# Patient Record
Sex: Male | Born: 1940 | Race: White | Hispanic: No | Marital: Married | Smoking: Former smoker
Health system: Southern US, Community
[De-identification: ages and names within clinical notes are randomized; demographics above are authoritative.]

## PROBLEM LIST (undated history)

## (undated) DIAGNOSIS — Z8711 Personal history of peptic ulcer disease: Secondary | ICD-10-CM

## (undated) DIAGNOSIS — E785 Hyperlipidemia, unspecified: Secondary | ICD-10-CM

## (undated) DIAGNOSIS — I10 Essential (primary) hypertension: Secondary | ICD-10-CM

## (undated) DIAGNOSIS — Z8719 Personal history of other diseases of the digestive system: Secondary | ICD-10-CM

## (undated) DIAGNOSIS — G5600 Carpal tunnel syndrome, unspecified upper limb: Secondary | ICD-10-CM

## (undated) HISTORY — PX: APPENDECTOMY: SHX54

## (undated) HISTORY — PX: HERNIA REPAIR: SHX51

---

## 2008-06-09 ENCOUNTER — Ambulatory Visit (HOSPITAL_COMMUNITY): Admission: RE | Admit: 2008-06-09 | Discharge: 2008-06-09 | Payer: Self-pay | Admitting: Urology

## 2008-06-23 ENCOUNTER — Ambulatory Visit (HOSPITAL_COMMUNITY): Admission: RE | Admit: 2008-06-23 | Discharge: 2008-06-23 | Payer: Self-pay | Admitting: Urology

## 2013-10-22 ENCOUNTER — Other Ambulatory Visit (HOSPITAL_COMMUNITY): Payer: Self-pay | Admitting: Orthopedic Surgery

## 2013-10-22 ENCOUNTER — Ambulatory Visit (HOSPITAL_COMMUNITY)
Admission: RE | Admit: 2013-10-22 | Discharge: 2013-10-22 | Disposition: A | Discharge status: Home / Self Care | Payer: Medicare Other | Source: Ambulatory Visit | Attending: Vascular Surgery | Admitting: Vascular Surgery

## 2013-10-22 DIAGNOSIS — R52 Pain, unspecified: Secondary | ICD-10-CM

## 2013-10-22 DIAGNOSIS — M79609 Pain in unspecified limb: Secondary | ICD-10-CM | POA: Insufficient documentation

## 2013-10-22 DIAGNOSIS — R6889 Other general symptoms and signs: Secondary | ICD-10-CM

## 2016-02-01 DIAGNOSIS — G6289 Other specified polyneuropathies: Secondary | ICD-10-CM | POA: Diagnosis not present

## 2016-02-01 DIAGNOSIS — E782 Mixed hyperlipidemia: Secondary | ICD-10-CM | POA: Diagnosis not present

## 2016-02-01 DIAGNOSIS — N183 Chronic kidney disease, stage 3 (moderate): Secondary | ICD-10-CM | POA: Diagnosis not present

## 2016-02-01 DIAGNOSIS — Z0001 Encounter for general adult medical examination with abnormal findings: Secondary | ICD-10-CM | POA: Diagnosis not present

## 2016-02-01 DIAGNOSIS — Z9189 Other specified personal risk factors, not elsewhere classified: Secondary | ICD-10-CM | POA: Diagnosis not present

## 2016-02-08 DIAGNOSIS — Z23 Encounter for immunization: Secondary | ICD-10-CM | POA: Diagnosis not present

## 2016-02-08 DIAGNOSIS — Z0001 Encounter for general adult medical examination with abnormal findings: Secondary | ICD-10-CM | POA: Diagnosis not present

## 2016-02-08 DIAGNOSIS — Z6822 Body mass index (BMI) 22.0-22.9, adult: Secondary | ICD-10-CM | POA: Diagnosis not present

## 2016-02-08 DIAGNOSIS — E782 Mixed hyperlipidemia: Secondary | ICD-10-CM | POA: Diagnosis not present

## 2016-02-08 DIAGNOSIS — K219 Gastro-esophageal reflux disease without esophagitis: Secondary | ICD-10-CM | POA: Diagnosis not present

## 2016-02-08 DIAGNOSIS — Z122 Encounter for screening for malignant neoplasm of respiratory organs: Secondary | ICD-10-CM | POA: Diagnosis not present

## 2016-08-11 DIAGNOSIS — N183 Chronic kidney disease, stage 3 (moderate): Secondary | ICD-10-CM | POA: Diagnosis not present

## 2016-08-11 DIAGNOSIS — E782 Mixed hyperlipidemia: Secondary | ICD-10-CM | POA: Diagnosis not present

## 2016-08-17 DIAGNOSIS — Z23 Encounter for immunization: Secondary | ICD-10-CM | POA: Diagnosis not present

## 2016-08-17 DIAGNOSIS — Z6823 Body mass index (BMI) 23.0-23.9, adult: Secondary | ICD-10-CM | POA: Diagnosis not present

## 2016-08-17 DIAGNOSIS — E782 Mixed hyperlipidemia: Secondary | ICD-10-CM | POA: Diagnosis not present

## 2016-08-17 DIAGNOSIS — K219 Gastro-esophageal reflux disease without esophagitis: Secondary | ICD-10-CM | POA: Diagnosis not present

## 2016-10-15 DIAGNOSIS — Z6823 Body mass index (BMI) 23.0-23.9, adult: Secondary | ICD-10-CM | POA: Diagnosis not present

## 2016-10-15 DIAGNOSIS — J0100 Acute maxillary sinusitis, unspecified: Secondary | ICD-10-CM | POA: Diagnosis not present

## 2016-11-08 DIAGNOSIS — R05 Cough: Secondary | ICD-10-CM | POA: Diagnosis not present

## 2016-11-08 DIAGNOSIS — Z6823 Body mass index (BMI) 23.0-23.9, adult: Secondary | ICD-10-CM | POA: Diagnosis not present

## 2017-02-10 DIAGNOSIS — E782 Mixed hyperlipidemia: Secondary | ICD-10-CM | POA: Diagnosis not present

## 2017-02-10 DIAGNOSIS — Z0001 Encounter for general adult medical examination with abnormal findings: Secondary | ICD-10-CM | POA: Diagnosis not present

## 2017-02-10 DIAGNOSIS — Z23 Encounter for immunization: Secondary | ICD-10-CM | POA: Diagnosis not present

## 2017-02-10 DIAGNOSIS — Z122 Encounter for screening for malignant neoplasm of respiratory organs: Secondary | ICD-10-CM | POA: Diagnosis not present

## 2017-02-10 DIAGNOSIS — K219 Gastro-esophageal reflux disease without esophagitis: Secondary | ICD-10-CM | POA: Diagnosis not present

## 2017-04-11 DIAGNOSIS — Z6823 Body mass index (BMI) 23.0-23.9, adult: Secondary | ICD-10-CM | POA: Diagnosis not present

## 2017-04-11 DIAGNOSIS — I10 Essential (primary) hypertension: Secondary | ICD-10-CM | POA: Diagnosis not present

## 2017-04-13 DIAGNOSIS — H2523 Age-related cataract, morgagnian type, bilateral: Secondary | ICD-10-CM | POA: Diagnosis not present

## 2017-04-13 DIAGNOSIS — I10 Essential (primary) hypertension: Secondary | ICD-10-CM | POA: Diagnosis not present

## 2017-04-13 DIAGNOSIS — Z6823 Body mass index (BMI) 23.0-23.9, adult: Secondary | ICD-10-CM | POA: Diagnosis not present

## 2017-04-17 ENCOUNTER — Encounter (HOSPITAL_COMMUNITY): Payer: Self-pay

## 2017-04-17 ENCOUNTER — Emergency Department (HOSPITAL_COMMUNITY)
Admission: EM | Admit: 2017-04-17 | Discharge: 2017-04-17 | Disposition: A | Discharge status: Home / Self Care | Payer: Medicare Other | Attending: Emergency Medicine | Admitting: Emergency Medicine

## 2017-04-17 ENCOUNTER — Emergency Department (HOSPITAL_COMMUNITY): Payer: Medicare Other

## 2017-04-17 DIAGNOSIS — F17220 Nicotine dependence, chewing tobacco, uncomplicated: Secondary | ICD-10-CM | POA: Diagnosis not present

## 2017-04-17 DIAGNOSIS — I10 Essential (primary) hypertension: Secondary | ICD-10-CM | POA: Insufficient documentation

## 2017-04-17 DIAGNOSIS — F17210 Nicotine dependence, cigarettes, uncomplicated: Secondary | ICD-10-CM | POA: Diagnosis not present

## 2017-04-17 DIAGNOSIS — Z79899 Other long term (current) drug therapy: Secondary | ICD-10-CM | POA: Diagnosis not present

## 2017-04-17 DIAGNOSIS — R42 Dizziness and giddiness: Secondary | ICD-10-CM | POA: Insufficient documentation

## 2017-04-17 DIAGNOSIS — H538 Other visual disturbances: Secondary | ICD-10-CM | POA: Diagnosis not present

## 2017-04-17 HISTORY — DX: Personal history of peptic ulcer disease: Z87.11

## 2017-04-17 HISTORY — DX: Essential (primary) hypertension: I10

## 2017-04-17 HISTORY — DX: Hyperlipidemia, unspecified: E78.5

## 2017-04-17 HISTORY — DX: Personal history of other diseases of the digestive system: Z87.19

## 2017-04-17 HISTORY — DX: Carpal tunnel syndrome, unspecified upper limb: G56.00

## 2017-04-17 LAB — COMPREHENSIVE METABOLIC PANEL
ALT: 19 U/L (ref 17–63)
AST: 22 U/L (ref 15–41)
Albumin: 4.2 g/dL (ref 3.5–5.0)
Alkaline Phosphatase: 54 U/L (ref 38–126)
Anion gap: 6 (ref 5–15)
BUN: 25 mg/dL — ABNORMAL HIGH (ref 6–20)
CHLORIDE: 107 mmol/L (ref 101–111)
CO2: 27 mmol/L (ref 22–32)
Calcium: 9.1 mg/dL (ref 8.9–10.3)
Creatinine, Ser: 1.21 mg/dL (ref 0.61–1.24)
GFR, EST NON AFRICAN AMERICAN: 56 mL/min — AB (ref >60)
Glucose, Bld: 104 mg/dL — ABNORMAL HIGH (ref 65–99)
POTASSIUM: 4 mmol/L (ref 3.5–5.1)
Sodium: 140 mmol/L (ref 135–145)
TOTAL PROTEIN: 7.2 g/dL (ref 6.5–8.1)
Total Bilirubin: 0.5 mg/dL (ref 0.3–1.2)

## 2017-04-17 LAB — CBC WITH DIFFERENTIAL/PLATELET
Basophils Absolute: 0.1 10*3/uL (ref 0.0–0.1)
Basophils Relative: 1 %
EOS PCT: 5 %
Eosinophils Absolute: 0.5 10*3/uL (ref 0.0–0.7)
HCT: 38.9 % — ABNORMAL LOW (ref 39.0–52.0)
Hemoglobin: 13.6 g/dL (ref 13.0–17.0)
LYMPHS ABS: 2.9 10*3/uL (ref 0.7–4.0)
LYMPHS PCT: 31 %
MCH: 32.2 pg (ref 26.0–34.0)
MCHC: 35 g/dL (ref 30.0–36.0)
MCV: 92 fL (ref 78.0–100.0)
MONO ABS: 0.7 10*3/uL (ref 0.1–1.0)
MONOS PCT: 7 %
Neutro Abs: 5.4 10*3/uL (ref 1.7–7.7)
Neutrophils Relative %: 56 %
PLATELETS: 251 10*3/uL (ref 150–400)
RBC: 4.23 MIL/uL (ref 4.22–5.81)
RDW: 12.8 % (ref 11.5–15.5)
WBC: 9.5 10*3/uL (ref 4.0–10.5)

## 2017-04-17 NOTE — ED Notes (Signed)
Pt states feeling better at this time. Still has some vision changes, but family says that was going on before events today. Has appointment w/ opthalmolgist this week. Pt says when this starts he has been bent over both times.

## 2017-04-17 NOTE — ED Notes (Signed)
Pt alert & oriented x4, stable gait. Patient given discharge instructions, paperwork & prescription(s). Patient  instructed to stop at the registration desk to finish any additional paperwork. Patient verbalized understanding. Pt left department w/ no further questions. 

## 2017-04-17 NOTE — ED Provider Notes (Signed)
[facility]PT Provider Note   CSN: [id number] Arrival date & time: 04/17/17  1516     History   Chief Complaint Chief Complaint  Patient presents with  . Hypertension    HPI Brian Sexton is a 76 y.o. male.  Patient states that he recently started on blood pressure medicine because he saw the doctor when he was having some blurred vision and dizziness and his blood pressure was elevated. He states that he still having some of these episodes. He has an appointment 2 days with the eye doctor.   The history is provided by the patient. No language interpreter was used.  Hypertension  This is a recurrent problem. The problem occurs constantly. The problem has not changed since onset.Pertinent negatives include no chest pain, no abdominal pain and no headaches. Nothing aggravates the symptoms. Treatments tried: Blood pressure medicine. The treatment provided mild relief.    Past Medical History:  Diagnosis Date  . Carpal tunnel syndrome   . History of stomach ulcers   . Hyperlipidemia   . Hypertension     There are no active problems to display for this patient.   Past Surgical History:  Procedure Laterality Date  . APPENDECTOMY    . HERNIA REPAIR         Home Medications    Prior to Admission medications   Medication Sig Start Date End Date Taking? Authorizing Provider  lisinopril (PRINIVIL,ZESTRIL) 20 MG tablet Take 20 mg by mouth daily.   Yes [provider]  lovastatin (MEVACOR) 20 MG tablet Take 40 mg by mouth daily.   Yes [provider]  predniSONE (DELTASONE) 50 MG tablet Take 50 mg by mouth daily with breakfast. 5 day course for inflammation   Yes [provider]    Family History No family history on file.  Social History Social History  Substance Use Topics  . Smoking status: Former Games developer  . Smokeless tobacco: Current User    Types: Chew     Comment: quit 27 years ago  . Alcohol use No     Allergies   Aspirin  and Penicillins   Review of Systems Review of Systems  Constitutional: Negative for appetite change and fatigue.  HENT: Negative for congestion, ear discharge and sinus pressure.        Some blurred vision  Eyes: Negative for discharge.  Respiratory: Negative for cough.   Cardiovascular: Negative for chest pain.  Gastrointestinal: Negative for abdominal pain and diarrhea.  Genitourinary: Negative for frequency and hematuria.  Musculoskeletal: Negative for back pain.  Skin: Negative for rash.  Neurological: Negative for seizures and headaches.  Psychiatric/Behavioral: Negative for hallucinations.     Physical Exam Updated Vital Signs BP 138/77 (BP Location: Right Arm)   Pulse 60   Temp 98.2 F (36.8 C) (Oral)   Resp 16   Ht 5\' 9"  (1.753 m)   Wt 154 lb (69.9 kg)   SpO2 97%   BMI 22.74 kg/m   Physical Exam  Constitutional: He is oriented to person, place, and time. He appears well-developed.  HENT:  Head: Normocephalic.  Eyes: Conjunctivae and EOM are normal. No scleral icterus.  Neck: Neck supple. No thyromegaly present.  Cardiovascular: Normal rate and regular rhythm.  Exam reveals no gallop and no friction rub.   No murmur heard. Pulmonary/Chest: No stridor. He has no wheezes. He has no rales. He exhibits no tenderness.  Abdominal: He exhibits no distension. There is no tenderness. There is no rebound.  Musculoskeletal: Normal range of motion. He exhibits no edema.  Lymphadenopathy:    He has no cervical adenopathy.  Neurological: He is oriented to person, place, and time. He exhibits normal muscle tone. Coordination normal.  Skin: No rash noted. No erythema.  Psychiatric: He has a normal mood and affect. His behavior is normal.     ED Treatments / Results  Labs (all labs ordered are listed, but only abnormal results are displayed) Labs Reviewed  CBC WITH DIFFERENTIAL/PLATELET - Abnormal; Notable for the following:       Result Value   HCT 38.9 (*)    All  other components within normal limits  COMPREHENSIVE METABOLIC PANEL - Abnormal; Notable for the following:    Glucose, Bld 104 (*)    BUN 25 (*)    GFR calc non Af Amer 56 (*)    All other components within normal limits    EKG  EKG Interpretation None       Radiology Ct Head Wo Contrast  Result Date: 04/17/2017 CLINICAL DATA:  Dizziness and vision changes while working in garden today, similar symptoms for week. History of hypertension, hyperlipidemia. EXAM: CT HEAD WITHOUT CONTRAST TECHNIQUE: Contiguous axial images were obtained from the base of the skull through the vertex without intravenous contrast. COMPARISON:  None. FINDINGS: BRAIN: No intraparenchymal hemorrhage, mass effect nor midline shift. The ventricles and sulci are normal for age. Patchy supratentorial white matter hypodensities less than expected for patient's age, though non-specific are most compatible with chronic small vessel ischemic disease. No acute large vascular territory infarcts. No abnormal extra-axial fluid collections. Basal cisterns are patent. VASCULAR: Mild calcific atherosclerosis of the carotid siphons. SKULL: No skull fracture. No significant scalp soft tissue swelling. SINUSES/ORBITS: The mastoid air-cells and included paranasal sinuses are well-aerated.The included ocular globes and orbital contents are non-suspicious. OTHER: Patient is edentulous. IMPRESSION: Normal noncontrast CT HEAD for age. Electronically Signed   By: Awilda Metroourtnay  Bloomer M.D.   On: 04/17/2017 19:01    Procedures Procedures (including critical care time)  Medications Ordered in ED Medications - No data to display   Initial Impression / Assessment and Plan / ED Course  I have reviewed the triage vital signs and the nursing notes.  Pertinent labs & imaging results that were available during my care of the patient were reviewed by me and considered in my medical decision making (see chart for details).     Labs unremarkable  CT scan head normal. Patient instructed to continue taking his blood pressure medicine as prescribed by his doctor and follow-up with the eye doctor in 2 days as planned. He is then to follow up with family doctor  Final Clinical Impressions(s) / ED Diagnoses   Final diagnoses:  Blurred vision  Essential hypertension    New Prescriptions New Prescriptions   No medications on file     Bethann BerkshireZammit, Disaya Walt, MD 04/17/17 2114

## 2017-04-17 NOTE — Discharge Instructions (Signed)
Follow up with the eye md in 2 days as planned

## 2017-04-17 NOTE — ED Triage Notes (Addendum)
Pt reports that he was working in the garden this morning and everything vision started getting blurry and dizziness. BP checked BP 168/84. Pt reports that he started on lisinopril 20 mg last week for his BP. Attempted to get in to see Dr Darl HouseholderKoneswaren. Pt still complains of blurry vision and slight HA

## 2017-04-29 DIAGNOSIS — H524 Presbyopia: Secondary | ICD-10-CM | POA: Diagnosis not present

## 2017-05-04 DIAGNOSIS — I10 Essential (primary) hypertension: Secondary | ICD-10-CM | POA: Diagnosis not present

## 2017-05-04 DIAGNOSIS — E782 Mixed hyperlipidemia: Secondary | ICD-10-CM | POA: Diagnosis not present

## 2017-05-04 DIAGNOSIS — K219 Gastro-esophageal reflux disease without esophagitis: Secondary | ICD-10-CM | POA: Diagnosis not present

## 2017-05-04 DIAGNOSIS — Z1212 Encounter for screening for malignant neoplasm of rectum: Secondary | ICD-10-CM | POA: Diagnosis not present

## 2017-05-26 DIAGNOSIS — H25013 Cortical age-related cataract, bilateral: Secondary | ICD-10-CM | POA: Diagnosis not present

## 2017-05-26 DIAGNOSIS — H5310 Unspecified subjective visual disturbances: Secondary | ICD-10-CM | POA: Diagnosis not present

## 2017-06-14 DIAGNOSIS — R509 Fever, unspecified: Secondary | ICD-10-CM | POA: Diagnosis not present

## 2017-06-14 DIAGNOSIS — J209 Acute bronchitis, unspecified: Secondary | ICD-10-CM | POA: Diagnosis not present

## 2017-06-14 DIAGNOSIS — Z6823 Body mass index (BMI) 23.0-23.9, adult: Secondary | ICD-10-CM | POA: Diagnosis not present

## 2017-06-16 DIAGNOSIS — H2513 Age-related nuclear cataract, bilateral: Secondary | ICD-10-CM | POA: Diagnosis not present

## 2017-06-16 DIAGNOSIS — H5310 Unspecified subjective visual disturbances: Secondary | ICD-10-CM | POA: Diagnosis not present

## 2017-06-16 DIAGNOSIS — H04123 Dry eye syndrome of bilateral lacrimal glands: Secondary | ICD-10-CM | POA: Diagnosis not present

## 2017-08-08 DIAGNOSIS — I10 Essential (primary) hypertension: Secondary | ICD-10-CM | POA: Diagnosis not present

## 2017-08-08 DIAGNOSIS — E782 Mixed hyperlipidemia: Secondary | ICD-10-CM | POA: Diagnosis not present

## 2017-08-08 DIAGNOSIS — H2523 Age-related cataract, morgagnian type, bilateral: Secondary | ICD-10-CM | POA: Diagnosis not present

## 2017-08-08 DIAGNOSIS — G6289 Other specified polyneuropathies: Secondary | ICD-10-CM | POA: Diagnosis not present

## 2017-08-11 DIAGNOSIS — K219 Gastro-esophageal reflux disease without esophagitis: Secondary | ICD-10-CM | POA: Diagnosis not present

## 2017-08-11 DIAGNOSIS — E782 Mixed hyperlipidemia: Secondary | ICD-10-CM | POA: Diagnosis not present

## 2017-08-11 DIAGNOSIS — Z23 Encounter for immunization: Secondary | ICD-10-CM | POA: Diagnosis not present

## 2017-08-11 DIAGNOSIS — Z122 Encounter for screening for malignant neoplasm of respiratory organs: Secondary | ICD-10-CM | POA: Diagnosis not present

## 2018-02-07 DIAGNOSIS — K210 Gastro-esophageal reflux disease with esophagitis: Secondary | ICD-10-CM | POA: Diagnosis not present

## 2018-02-07 DIAGNOSIS — N183 Chronic kidney disease, stage 3 (moderate): Secondary | ICD-10-CM | POA: Diagnosis not present

## 2018-02-07 DIAGNOSIS — E782 Mixed hyperlipidemia: Secondary | ICD-10-CM | POA: Diagnosis not present

## 2018-02-07 DIAGNOSIS — M1990 Unspecified osteoarthritis, unspecified site: Secondary | ICD-10-CM | POA: Diagnosis not present

## 2018-02-07 DIAGNOSIS — I10 Essential (primary) hypertension: Secondary | ICD-10-CM | POA: Diagnosis not present

## 2018-02-12 DIAGNOSIS — Z23 Encounter for immunization: Secondary | ICD-10-CM | POA: Diagnosis not present

## 2018-02-12 DIAGNOSIS — K219 Gastro-esophageal reflux disease without esophagitis: Secondary | ICD-10-CM | POA: Diagnosis not present

## 2018-02-12 DIAGNOSIS — Z0001 Encounter for general adult medical examination with abnormal findings: Secondary | ICD-10-CM | POA: Diagnosis not present

## 2018-02-12 DIAGNOSIS — E782 Mixed hyperlipidemia: Secondary | ICD-10-CM | POA: Diagnosis not present

## 2018-03-06 DIAGNOSIS — R195 Other fecal abnormalities: Secondary | ICD-10-CM | POA: Diagnosis not present

## 2018-03-08 DIAGNOSIS — K635 Polyp of colon: Secondary | ICD-10-CM | POA: Diagnosis not present

## 2018-03-08 DIAGNOSIS — K219 Gastro-esophageal reflux disease without esophagitis: Secondary | ICD-10-CM | POA: Diagnosis not present

## 2018-03-08 DIAGNOSIS — D125 Benign neoplasm of sigmoid colon: Secondary | ICD-10-CM | POA: Diagnosis not present

## 2018-03-08 DIAGNOSIS — Z79899 Other long term (current) drug therapy: Secondary | ICD-10-CM | POA: Diagnosis not present

## 2018-03-08 DIAGNOSIS — R195 Other fecal abnormalities: Secondary | ICD-10-CM | POA: Diagnosis not present

## 2018-03-08 DIAGNOSIS — E785 Hyperlipidemia, unspecified: Secondary | ICD-10-CM | POA: Diagnosis not present

## 2018-03-08 DIAGNOSIS — I10 Essential (primary) hypertension: Secondary | ICD-10-CM | POA: Diagnosis not present

## 2018-03-08 DIAGNOSIS — D124 Benign neoplasm of descending colon: Secondary | ICD-10-CM | POA: Diagnosis not present

## 2018-03-08 DIAGNOSIS — Z880 Allergy status to penicillin: Secondary | ICD-10-CM | POA: Diagnosis not present

## 2018-03-08 DIAGNOSIS — Z886 Allergy status to analgesic agent status: Secondary | ICD-10-CM | POA: Diagnosis not present

## 2018-03-21 DIAGNOSIS — D124 Benign neoplasm of descending colon: Secondary | ICD-10-CM | POA: Diagnosis not present

## 2018-04-12 DIAGNOSIS — H04123 Dry eye syndrome of bilateral lacrimal glands: Secondary | ICD-10-CM | POA: Diagnosis not present

## 2018-08-13 DIAGNOSIS — K210 Gastro-esophageal reflux disease with esophagitis: Secondary | ICD-10-CM | POA: Diagnosis not present

## 2018-08-13 DIAGNOSIS — E782 Mixed hyperlipidemia: Secondary | ICD-10-CM | POA: Diagnosis not present

## 2018-08-13 DIAGNOSIS — I10 Essential (primary) hypertension: Secondary | ICD-10-CM | POA: Diagnosis not present

## 2018-08-13 DIAGNOSIS — N183 Chronic kidney disease, stage 3 (moderate): Secondary | ICD-10-CM | POA: Diagnosis not present

## 2018-08-13 DIAGNOSIS — E039 Hypothyroidism, unspecified: Secondary | ICD-10-CM | POA: Diagnosis not present

## 2018-08-13 DIAGNOSIS — Z9189 Other specified personal risk factors, not elsewhere classified: Secondary | ICD-10-CM | POA: Diagnosis not present

## 2018-08-16 DIAGNOSIS — I10 Essential (primary) hypertension: Secondary | ICD-10-CM | POA: Diagnosis not present

## 2018-08-16 DIAGNOSIS — K219 Gastro-esophageal reflux disease without esophagitis: Secondary | ICD-10-CM | POA: Diagnosis not present

## 2018-08-16 DIAGNOSIS — E782 Mixed hyperlipidemia: Secondary | ICD-10-CM | POA: Diagnosis not present

## 2018-08-16 DIAGNOSIS — Z23 Encounter for immunization: Secondary | ICD-10-CM | POA: Diagnosis not present

## 2018-12-07 DIAGNOSIS — J019 Acute sinusitis, unspecified: Secondary | ICD-10-CM | POA: Diagnosis not present

## 2018-12-07 DIAGNOSIS — J209 Acute bronchitis, unspecified: Secondary | ICD-10-CM | POA: Diagnosis not present

## 2018-12-07 DIAGNOSIS — Z6823 Body mass index (BMI) 23.0-23.9, adult: Secondary | ICD-10-CM | POA: Diagnosis not present

## 2019-02-05 IMAGING — CT CT HEAD W/O CM
3 series · 16 of 47 positions shown, 19 images · non-contrast
Comparison: None.

CLINICAL DATA: Dizziness and vision changes while working in garden
today, similar symptoms for week. History of hypertension,
hyperlipidemia.

EXAM:
CT HEAD WITHOUT CONTRAST
TECHNIQUE: Contiguous axial images were obtained from the base of the skull
through the vertex without intravenous contrast.

[Series 2: head wo · axial · 0.43mm/px · z∈[+1505,+1630]mm · 10 of 30 slices shown, 13 images]
[im 3/30  brain]
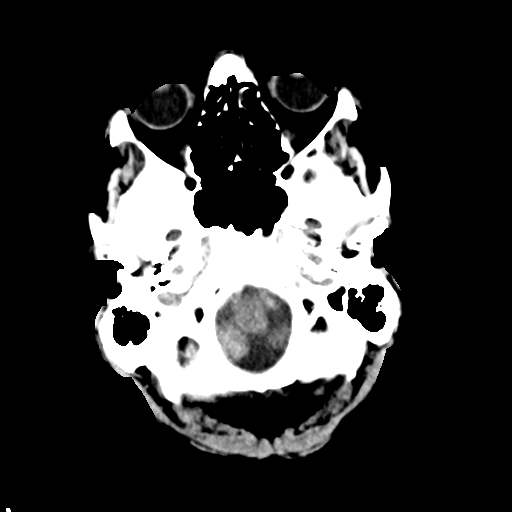
[im 3/30  bone]
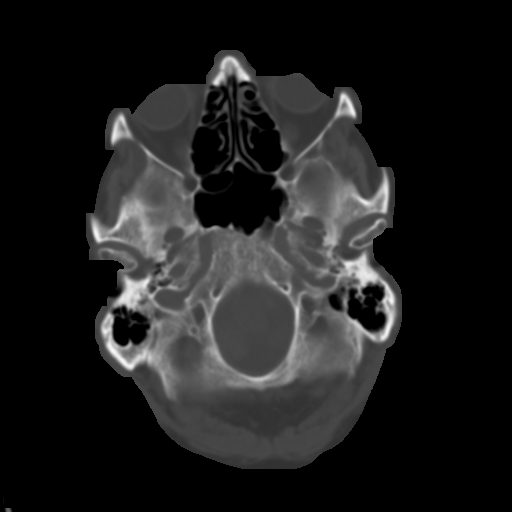
[im 6/30  brain]
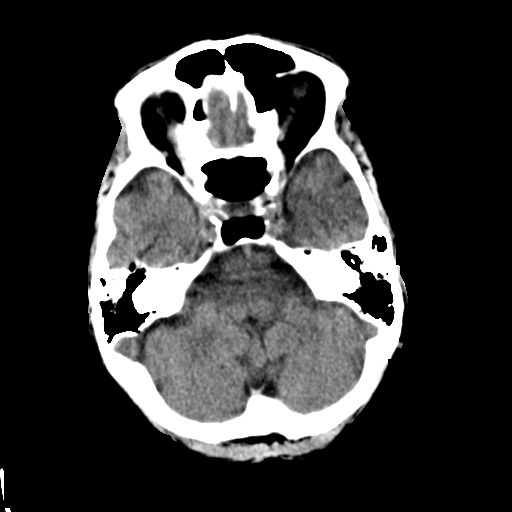
[im 9/30  brain]
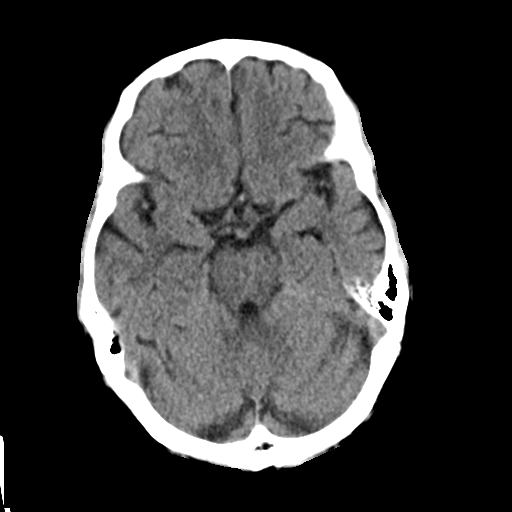
[im 11/30  brain]
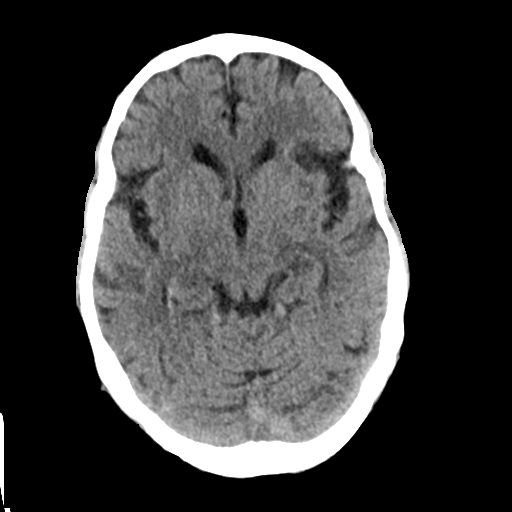
[im 14/30  brain]
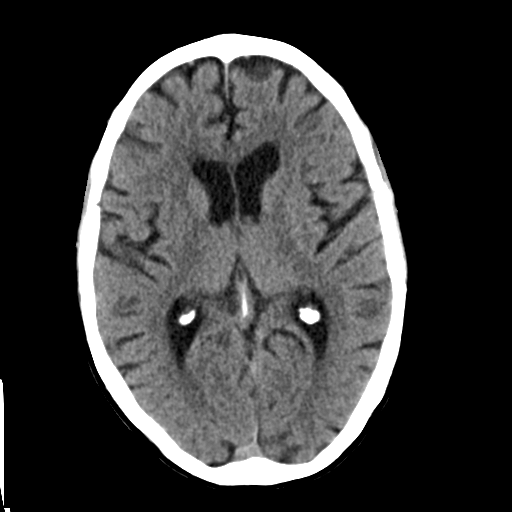
[im 14/30  bone]
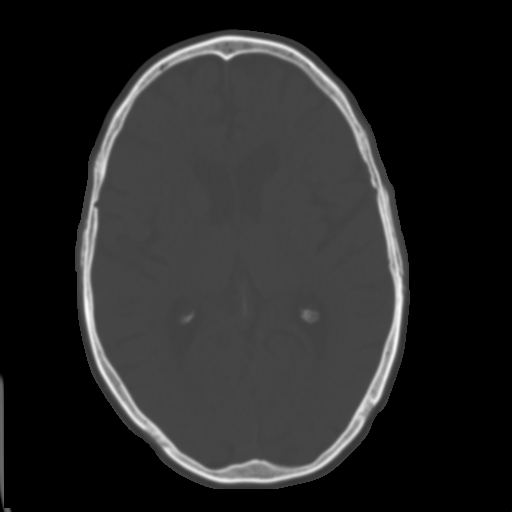
[im 17/30  brain]
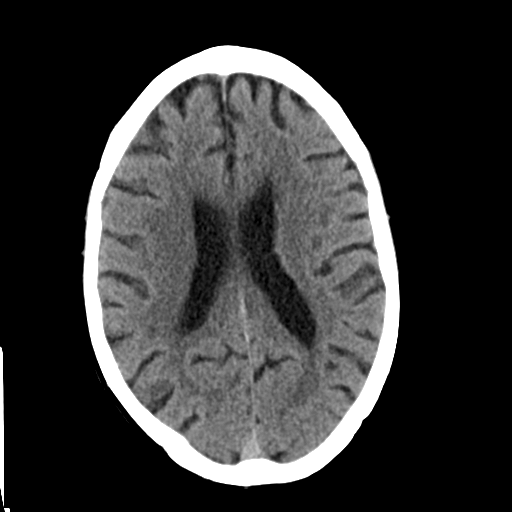
[im 20/30  brain]
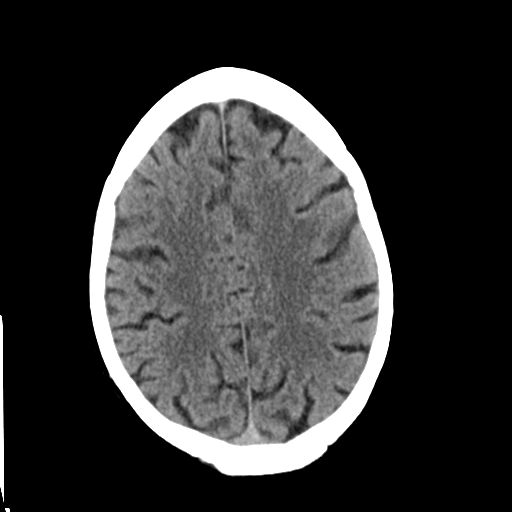
[im 23/30  brain]
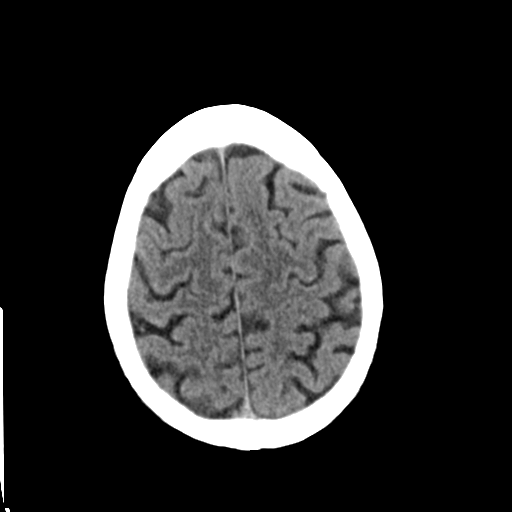
[im 25/30  brain]
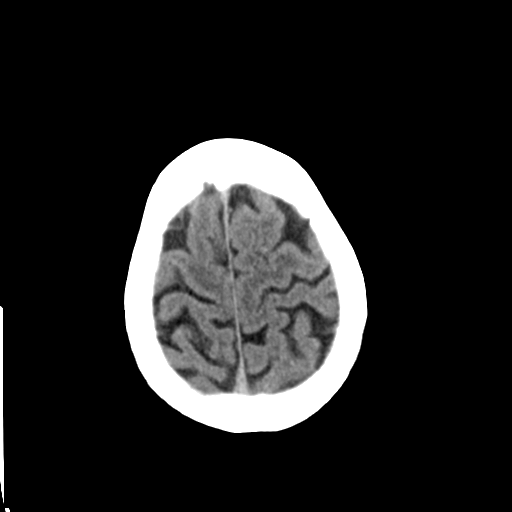
[im 25/30  bone]
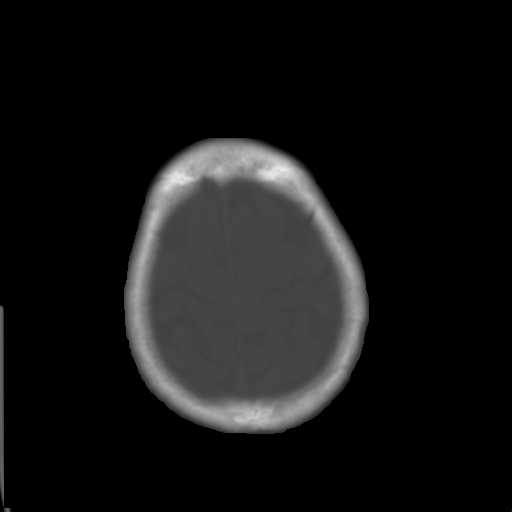
[im 28/30  brain]
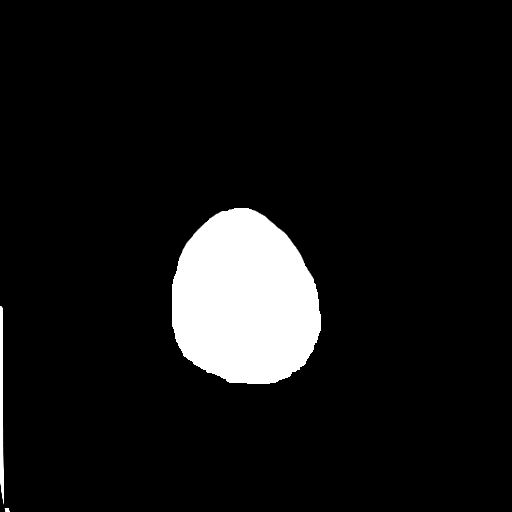

[Series 4: coronal soft tissue · coronal · 0.31mm/px · 3 of 68 slices shown]
[im 23/68  brain]
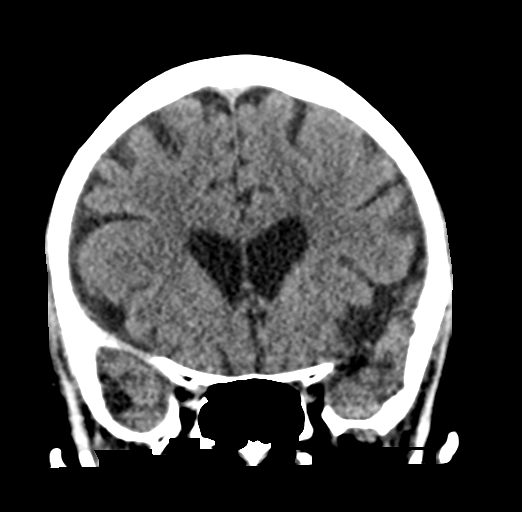
[im 30/68  brain]
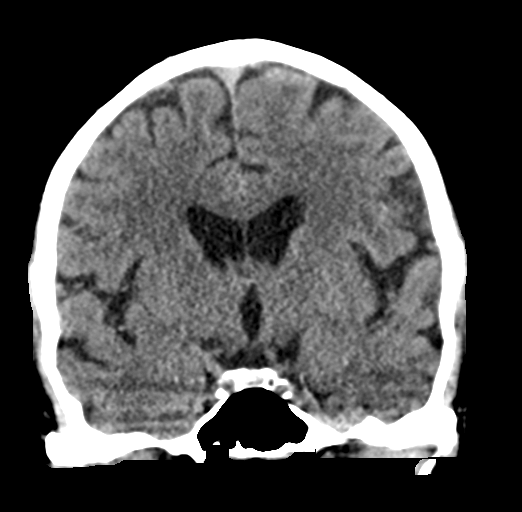
[im 38/68  brain]
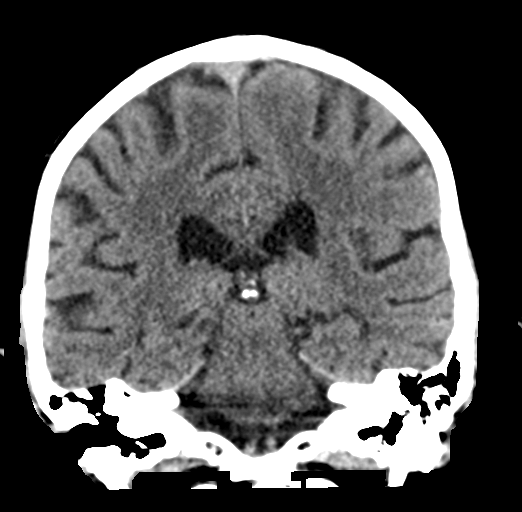

[Series 5: sagittal soft tissue · sagittal · 0.33mm/px · 3 of 51 slices shown]
[im 17/51  brain]
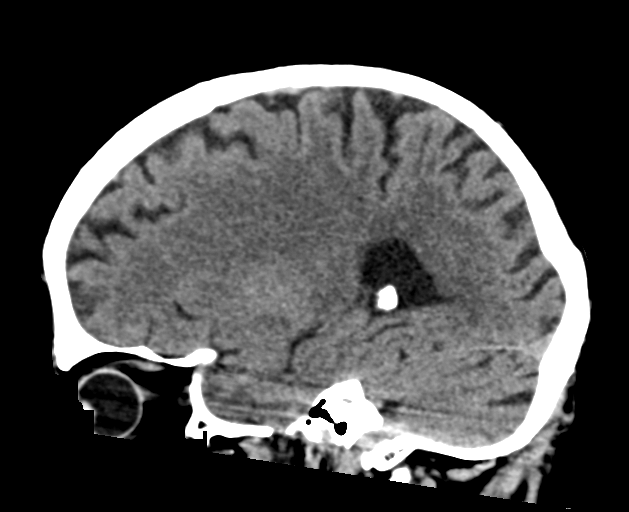
[im 26/51  brain]
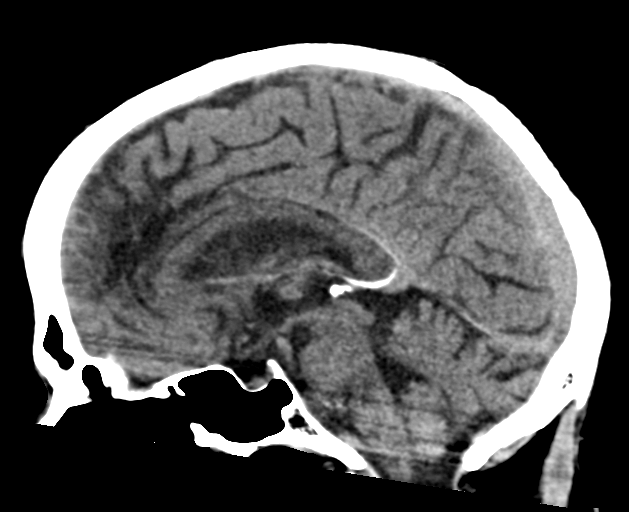
[im 34/51  brain]
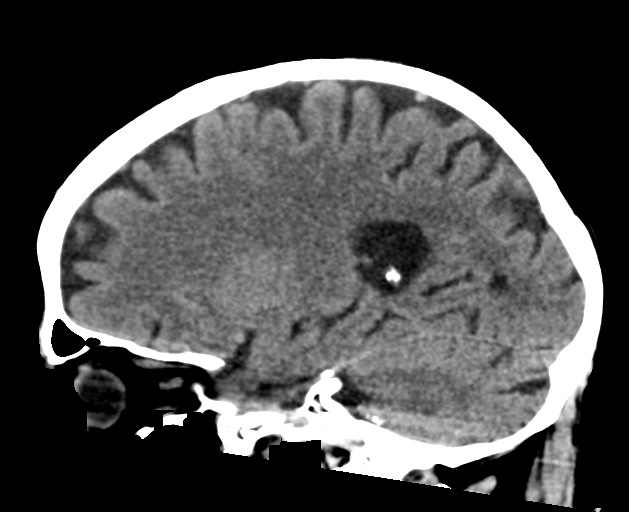

[16 of 47 positions shown; findings below may reference images not displayed]

FINDINGS: BRAIN: No intraparenchymal hemorrhage, mass effect nor midline
shift. The ventricles and sulci are normal for age. Patchy
supratentorial white matter hypodensities less than expected for
patient's age, though non-specific are most compatible with chronic
small vessel ischemic disease. No acute large vascular territory
infarcts. No abnormal extra-axial fluid collections. Basal cisterns
are patent.

VASCULAR: Mild calcific atherosclerosis of the carotid siphons.

SKULL: No skull fracture. No significant scalp soft tissue swelling.

SINUSES/ORBITS: The mastoid air-cells and included paranasal sinuses
are well-aerated.The included ocular globes and orbital contents are
non-suspicious.

OTHER: Patient is edentulous.
IMPRESSION: Normal noncontrast CT HEAD for age.

## 2019-04-11 DIAGNOSIS — I10 Essential (primary) hypertension: Secondary | ICD-10-CM | POA: Diagnosis not present

## 2019-04-11 DIAGNOSIS — E782 Mixed hyperlipidemia: Secondary | ICD-10-CM | POA: Diagnosis not present

## 2019-04-11 DIAGNOSIS — N183 Chronic kidney disease, stage 3 (moderate): Secondary | ICD-10-CM | POA: Diagnosis not present

## 2019-04-11 DIAGNOSIS — K210 Gastro-esophageal reflux disease with esophagitis: Secondary | ICD-10-CM | POA: Diagnosis not present

## 2019-04-15 DIAGNOSIS — E782 Mixed hyperlipidemia: Secondary | ICD-10-CM | POA: Diagnosis not present

## 2019-04-15 DIAGNOSIS — Z0001 Encounter for general adult medical examination with abnormal findings: Secondary | ICD-10-CM | POA: Diagnosis not present

## 2019-04-15 DIAGNOSIS — K219 Gastro-esophageal reflux disease without esophagitis: Secondary | ICD-10-CM | POA: Diagnosis not present

## 2019-04-15 DIAGNOSIS — I10 Essential (primary) hypertension: Secondary | ICD-10-CM | POA: Diagnosis not present

## 2019-07-05 DIAGNOSIS — E785 Hyperlipidemia, unspecified: Secondary | ICD-10-CM | POA: Diagnosis not present

## 2019-07-05 DIAGNOSIS — I10 Essential (primary) hypertension: Secondary | ICD-10-CM | POA: Diagnosis not present

## 2019-08-05 DIAGNOSIS — I10 Essential (primary) hypertension: Secondary | ICD-10-CM | POA: Diagnosis not present

## 2019-08-05 DIAGNOSIS — E782 Mixed hyperlipidemia: Secondary | ICD-10-CM | POA: Diagnosis not present

## 2019-08-21 DIAGNOSIS — Z23 Encounter for immunization: Secondary | ICD-10-CM | POA: Diagnosis not present

## 2019-10-04 DIAGNOSIS — I10 Essential (primary) hypertension: Secondary | ICD-10-CM | POA: Diagnosis not present

## 2019-10-04 DIAGNOSIS — E782 Mixed hyperlipidemia: Secondary | ICD-10-CM | POA: Diagnosis not present

## 2019-10-11 DIAGNOSIS — E782 Mixed hyperlipidemia: Secondary | ICD-10-CM | POA: Diagnosis not present

## 2019-10-11 DIAGNOSIS — N183 Chronic kidney disease, stage 3 unspecified: Secondary | ICD-10-CM | POA: Diagnosis not present

## 2019-10-11 DIAGNOSIS — I10 Essential (primary) hypertension: Secondary | ICD-10-CM | POA: Diagnosis not present

## 2019-10-11 DIAGNOSIS — K219 Gastro-esophageal reflux disease without esophagitis: Secondary | ICD-10-CM | POA: Diagnosis not present

## 2019-10-16 DIAGNOSIS — I10 Essential (primary) hypertension: Secondary | ICD-10-CM | POA: Diagnosis not present

## 2019-10-16 DIAGNOSIS — K219 Gastro-esophageal reflux disease without esophagitis: Secondary | ICD-10-CM | POA: Diagnosis not present

## 2019-10-16 DIAGNOSIS — E782 Mixed hyperlipidemia: Secondary | ICD-10-CM | POA: Diagnosis not present

## 2019-10-16 DIAGNOSIS — Z122 Encounter for screening for malignant neoplasm of respiratory organs: Secondary | ICD-10-CM | POA: Diagnosis not present

## 2019-12-05 DIAGNOSIS — I10 Essential (primary) hypertension: Secondary | ICD-10-CM | POA: Diagnosis not present

## 2019-12-05 DIAGNOSIS — E782 Mixed hyperlipidemia: Secondary | ICD-10-CM | POA: Diagnosis not present

## 2020-01-31 DIAGNOSIS — E7849 Other hyperlipidemia: Secondary | ICD-10-CM | POA: Diagnosis not present

## 2020-01-31 DIAGNOSIS — I10 Essential (primary) hypertension: Secondary | ICD-10-CM | POA: Diagnosis not present

## 2020-03-04 DIAGNOSIS — E7849 Other hyperlipidemia: Secondary | ICD-10-CM | POA: Diagnosis not present

## 2020-03-04 DIAGNOSIS — I10 Essential (primary) hypertension: Secondary | ICD-10-CM | POA: Diagnosis not present

## 2020-04-02 DIAGNOSIS — Z6822 Body mass index (BMI) 22.0-22.9, adult: Secondary | ICD-10-CM | POA: Diagnosis not present

## 2020-04-02 DIAGNOSIS — M25551 Pain in right hip: Secondary | ICD-10-CM | POA: Diagnosis not present

## 2020-04-02 DIAGNOSIS — M25531 Pain in right wrist: Secondary | ICD-10-CM | POA: Diagnosis not present

## 2020-04-13 DIAGNOSIS — K219 Gastro-esophageal reflux disease without esophagitis: Secondary | ICD-10-CM | POA: Diagnosis not present

## 2020-04-13 DIAGNOSIS — D649 Anemia, unspecified: Secondary | ICD-10-CM | POA: Diagnosis not present

## 2020-04-13 DIAGNOSIS — E782 Mixed hyperlipidemia: Secondary | ICD-10-CM | POA: Diagnosis not present

## 2020-04-13 DIAGNOSIS — I10 Essential (primary) hypertension: Secondary | ICD-10-CM | POA: Diagnosis not present

## 2020-04-13 DIAGNOSIS — N183 Chronic kidney disease, stage 3 unspecified: Secondary | ICD-10-CM | POA: Diagnosis not present

## 2020-04-14 DIAGNOSIS — S63501A Unspecified sprain of right wrist, initial encounter: Secondary | ICD-10-CM | POA: Diagnosis not present

## 2020-04-14 DIAGNOSIS — M25531 Pain in right wrist: Secondary | ICD-10-CM | POA: Diagnosis not present

## 2020-04-17 DIAGNOSIS — K219 Gastro-esophageal reflux disease without esophagitis: Secondary | ICD-10-CM | POA: Diagnosis not present

## 2020-04-17 DIAGNOSIS — I10 Essential (primary) hypertension: Secondary | ICD-10-CM | POA: Diagnosis not present

## 2020-04-17 DIAGNOSIS — Z0001 Encounter for general adult medical examination with abnormal findings: Secondary | ICD-10-CM | POA: Diagnosis not present

## 2020-04-17 DIAGNOSIS — E782 Mixed hyperlipidemia: Secondary | ICD-10-CM | POA: Diagnosis not present

## 2020-04-23 DIAGNOSIS — E538 Deficiency of other specified B group vitamins: Secondary | ICD-10-CM | POA: Diagnosis not present

## 2020-04-30 DIAGNOSIS — E538 Deficiency of other specified B group vitamins: Secondary | ICD-10-CM | POA: Diagnosis not present

## 2020-05-07 DIAGNOSIS — E538 Deficiency of other specified B group vitamins: Secondary | ICD-10-CM | POA: Diagnosis not present

## 2020-05-25 DIAGNOSIS — R195 Other fecal abnormalities: Secondary | ICD-10-CM | POA: Diagnosis not present

## 2020-06-03 DIAGNOSIS — E7849 Other hyperlipidemia: Secondary | ICD-10-CM | POA: Diagnosis not present

## 2020-06-03 DIAGNOSIS — K219 Gastro-esophageal reflux disease without esophagitis: Secondary | ICD-10-CM | POA: Diagnosis not present

## 2020-06-03 DIAGNOSIS — I129 Hypertensive chronic kidney disease with stage 1 through stage 4 chronic kidney disease, or unspecified chronic kidney disease: Secondary | ICD-10-CM | POA: Diagnosis not present

## 2020-06-03 DIAGNOSIS — N183 Chronic kidney disease, stage 3 unspecified: Secondary | ICD-10-CM | POA: Diagnosis not present

## 2020-06-04 DIAGNOSIS — E538 Deficiency of other specified B group vitamins: Secondary | ICD-10-CM | POA: Diagnosis not present

## 2020-06-20 DIAGNOSIS — R197 Diarrhea, unspecified: Secondary | ICD-10-CM | POA: Diagnosis not present

## 2020-06-20 DIAGNOSIS — K921 Melena: Secondary | ICD-10-CM | POA: Diagnosis not present

## 2020-06-23 DIAGNOSIS — Z01818 Encounter for other preprocedural examination: Secondary | ICD-10-CM | POA: Diagnosis not present

## 2020-06-25 DIAGNOSIS — D126 Benign neoplasm of colon, unspecified: Secondary | ICD-10-CM | POA: Diagnosis not present

## 2020-06-25 DIAGNOSIS — I10 Essential (primary) hypertension: Secondary | ICD-10-CM | POA: Diagnosis not present

## 2020-06-25 DIAGNOSIS — D123 Benign neoplasm of transverse colon: Secondary | ICD-10-CM | POA: Diagnosis not present

## 2020-06-25 DIAGNOSIS — R195 Other fecal abnormalities: Secondary | ICD-10-CM | POA: Diagnosis not present

## 2020-06-25 DIAGNOSIS — K635 Polyp of colon: Secondary | ICD-10-CM | POA: Diagnosis not present

## 2020-06-25 DIAGNOSIS — K640 First degree hemorrhoids: Secondary | ICD-10-CM | POA: Diagnosis not present

## 2020-06-25 DIAGNOSIS — Z87891 Personal history of nicotine dependence: Secondary | ICD-10-CM | POA: Diagnosis not present

## 2020-06-25 DIAGNOSIS — E785 Hyperlipidemia, unspecified: Secondary | ICD-10-CM | POA: Diagnosis not present

## 2020-06-25 DIAGNOSIS — Z880 Allergy status to penicillin: Secondary | ICD-10-CM | POA: Diagnosis not present

## 2020-06-25 DIAGNOSIS — K620 Anal polyp: Secondary | ICD-10-CM | POA: Diagnosis not present

## 2020-06-25 DIAGNOSIS — K5730 Diverticulosis of large intestine without perforation or abscess without bleeding: Secondary | ICD-10-CM | POA: Diagnosis not present

## 2020-06-25 DIAGNOSIS — K5732 Diverticulitis of large intestine without perforation or abscess without bleeding: Secondary | ICD-10-CM | POA: Diagnosis not present

## 2020-06-25 DIAGNOSIS — Z79899 Other long term (current) drug therapy: Secondary | ICD-10-CM | POA: Diagnosis not present

## 2020-06-25 DIAGNOSIS — M1990 Unspecified osteoarthritis, unspecified site: Secondary | ICD-10-CM | POA: Diagnosis not present

## 2020-07-03 DIAGNOSIS — N183 Chronic kidney disease, stage 3 unspecified: Secondary | ICD-10-CM | POA: Diagnosis not present

## 2020-07-03 DIAGNOSIS — I129 Hypertensive chronic kidney disease with stage 1 through stage 4 chronic kidney disease, or unspecified chronic kidney disease: Secondary | ICD-10-CM | POA: Diagnosis not present

## 2020-07-03 DIAGNOSIS — E7849 Other hyperlipidemia: Secondary | ICD-10-CM | POA: Diagnosis not present

## 2020-07-03 DIAGNOSIS — K219 Gastro-esophageal reflux disease without esophagitis: Secondary | ICD-10-CM | POA: Diagnosis not present

## 2020-07-06 DIAGNOSIS — E538 Deficiency of other specified B group vitamins: Secondary | ICD-10-CM | POA: Diagnosis not present

## 2020-07-22 DIAGNOSIS — D126 Benign neoplasm of colon, unspecified: Secondary | ICD-10-CM | POA: Diagnosis not present

## 2020-08-05 DIAGNOSIS — E538 Deficiency of other specified B group vitamins: Secondary | ICD-10-CM | POA: Diagnosis not present

## 2020-08-20 DIAGNOSIS — Z20828 Contact with and (suspected) exposure to other viral communicable diseases: Secondary | ICD-10-CM | POA: Diagnosis not present

## 2020-09-03 DIAGNOSIS — K219 Gastro-esophageal reflux disease without esophagitis: Secondary | ICD-10-CM | POA: Diagnosis not present

## 2020-09-03 DIAGNOSIS — I129 Hypertensive chronic kidney disease with stage 1 through stage 4 chronic kidney disease, or unspecified chronic kidney disease: Secondary | ICD-10-CM | POA: Diagnosis not present

## 2020-09-03 DIAGNOSIS — E7849 Other hyperlipidemia: Secondary | ICD-10-CM | POA: Diagnosis not present

## 2020-09-03 DIAGNOSIS — Z23 Encounter for immunization: Secondary | ICD-10-CM | POA: Diagnosis not present

## 2020-09-03 DIAGNOSIS — N183 Chronic kidney disease, stage 3 unspecified: Secondary | ICD-10-CM | POA: Diagnosis not present

## 2020-09-04 DIAGNOSIS — E538 Deficiency of other specified B group vitamins: Secondary | ICD-10-CM | POA: Diagnosis not present

## 2020-10-05 DIAGNOSIS — E538 Deficiency of other specified B group vitamins: Secondary | ICD-10-CM | POA: Diagnosis not present

## 2020-10-26 DIAGNOSIS — D649 Anemia, unspecified: Secondary | ICD-10-CM | POA: Diagnosis not present

## 2020-10-26 DIAGNOSIS — K219 Gastro-esophageal reflux disease without esophagitis: Secondary | ICD-10-CM | POA: Diagnosis not present

## 2020-10-26 DIAGNOSIS — D519 Vitamin B12 deficiency anemia, unspecified: Secondary | ICD-10-CM | POA: Diagnosis not present

## 2020-10-26 DIAGNOSIS — D529 Folate deficiency anemia, unspecified: Secondary | ICD-10-CM | POA: Diagnosis not present

## 2020-10-28 DIAGNOSIS — E782 Mixed hyperlipidemia: Secondary | ICD-10-CM | POA: Diagnosis not present

## 2020-10-28 DIAGNOSIS — I10 Essential (primary) hypertension: Secondary | ICD-10-CM | POA: Diagnosis not present

## 2020-10-28 DIAGNOSIS — K2100 Gastro-esophageal reflux disease with esophagitis, without bleeding: Secondary | ICD-10-CM | POA: Diagnosis not present

## 2020-10-28 DIAGNOSIS — D510 Vitamin B12 deficiency anemia due to intrinsic factor deficiency: Secondary | ICD-10-CM | POA: Diagnosis not present

## 2020-12-04 DIAGNOSIS — I129 Hypertensive chronic kidney disease with stage 1 through stage 4 chronic kidney disease, or unspecified chronic kidney disease: Secondary | ICD-10-CM | POA: Diagnosis not present

## 2020-12-04 DIAGNOSIS — E7849 Other hyperlipidemia: Secondary | ICD-10-CM | POA: Diagnosis not present

## 2020-12-04 DIAGNOSIS — N183 Chronic kidney disease, stage 3 unspecified: Secondary | ICD-10-CM | POA: Diagnosis not present

## 2020-12-04 DIAGNOSIS — K219 Gastro-esophageal reflux disease without esophagitis: Secondary | ICD-10-CM | POA: Diagnosis not present

## 2021-04-22 DIAGNOSIS — D519 Vitamin B12 deficiency anemia, unspecified: Secondary | ICD-10-CM | POA: Diagnosis not present

## 2021-04-22 DIAGNOSIS — N1832 Chronic kidney disease, stage 3b: Secondary | ICD-10-CM | POA: Diagnosis not present

## 2021-04-22 DIAGNOSIS — D649 Anemia, unspecified: Secondary | ICD-10-CM | POA: Diagnosis not present

## 2021-04-22 DIAGNOSIS — E538 Deficiency of other specified B group vitamins: Secondary | ICD-10-CM | POA: Diagnosis not present

## 2021-04-27 DIAGNOSIS — Z0001 Encounter for general adult medical examination with abnormal findings: Secondary | ICD-10-CM | POA: Diagnosis not present

## 2021-04-27 DIAGNOSIS — I10 Essential (primary) hypertension: Secondary | ICD-10-CM | POA: Diagnosis not present

## 2021-04-27 DIAGNOSIS — E7849 Other hyperlipidemia: Secondary | ICD-10-CM | POA: Diagnosis not present

## 2021-04-27 DIAGNOSIS — D510 Vitamin B12 deficiency anemia due to intrinsic factor deficiency: Secondary | ICD-10-CM | POA: Diagnosis not present

## 2021-09-30 DIAGNOSIS — Z23 Encounter for immunization: Secondary | ICD-10-CM | POA: Diagnosis not present

## 2021-10-22 DIAGNOSIS — D529 Folate deficiency anemia, unspecified: Secondary | ICD-10-CM | POA: Diagnosis not present

## 2021-10-22 DIAGNOSIS — D519 Vitamin B12 deficiency anemia, unspecified: Secondary | ICD-10-CM | POA: Diagnosis not present

## 2021-10-22 DIAGNOSIS — D649 Anemia, unspecified: Secondary | ICD-10-CM | POA: Diagnosis not present

## 2021-10-22 DIAGNOSIS — R7301 Impaired fasting glucose: Secondary | ICD-10-CM | POA: Diagnosis not present

## 2021-10-26 DIAGNOSIS — D510 Vitamin B12 deficiency anemia due to intrinsic factor deficiency: Secondary | ICD-10-CM | POA: Diagnosis not present

## 2021-10-26 DIAGNOSIS — E7849 Other hyperlipidemia: Secondary | ICD-10-CM | POA: Diagnosis not present

## 2021-10-26 DIAGNOSIS — K2100 Gastro-esophageal reflux disease with esophagitis, without bleeding: Secondary | ICD-10-CM | POA: Diagnosis not present

## 2021-10-26 DIAGNOSIS — I10 Essential (primary) hypertension: Secondary | ICD-10-CM | POA: Diagnosis not present

## 2021-12-08 DIAGNOSIS — M79642 Pain in left hand: Secondary | ICD-10-CM | POA: Diagnosis not present

## 2021-12-08 DIAGNOSIS — M13849 Other specified arthritis, unspecified hand: Secondary | ICD-10-CM | POA: Diagnosis not present

## 2021-12-08 DIAGNOSIS — G5603 Carpal tunnel syndrome, bilateral upper limbs: Secondary | ICD-10-CM | POA: Diagnosis not present

## 2021-12-10 DIAGNOSIS — G5602 Carpal tunnel syndrome, left upper limb: Secondary | ICD-10-CM | POA: Diagnosis not present

## 2022-04-26 DIAGNOSIS — K219 Gastro-esophageal reflux disease without esophagitis: Secondary | ICD-10-CM | POA: Diagnosis not present

## 2022-04-26 DIAGNOSIS — R7301 Impaired fasting glucose: Secondary | ICD-10-CM | POA: Diagnosis not present

## 2022-04-26 DIAGNOSIS — I10 Essential (primary) hypertension: Secondary | ICD-10-CM | POA: Diagnosis not present

## 2022-04-26 DIAGNOSIS — D649 Anemia, unspecified: Secondary | ICD-10-CM | POA: Diagnosis not present

## 2022-04-26 DIAGNOSIS — E782 Mixed hyperlipidemia: Secondary | ICD-10-CM | POA: Diagnosis not present

## 2022-04-29 DIAGNOSIS — I10 Essential (primary) hypertension: Secondary | ICD-10-CM | POA: Diagnosis not present

## 2022-04-29 DIAGNOSIS — Z0001 Encounter for general adult medical examination with abnormal findings: Secondary | ICD-10-CM | POA: Diagnosis not present

## 2022-04-29 DIAGNOSIS — D510 Vitamin B12 deficiency anemia due to intrinsic factor deficiency: Secondary | ICD-10-CM | POA: Diagnosis not present

## 2022-04-29 DIAGNOSIS — I700 Atherosclerosis of aorta: Secondary | ICD-10-CM | POA: Diagnosis not present

## 2022-10-10 DIAGNOSIS — R030 Elevated blood-pressure reading, without diagnosis of hypertension: Secondary | ICD-10-CM | POA: Diagnosis not present

## 2022-10-10 DIAGNOSIS — Z6822 Body mass index (BMI) 22.0-22.9, adult: Secondary | ICD-10-CM | POA: Diagnosis not present

## 2022-10-10 DIAGNOSIS — L03119 Cellulitis of unspecified part of limb: Secondary | ICD-10-CM | POA: Diagnosis not present

## 2022-10-17 DIAGNOSIS — D649 Anemia, unspecified: Secondary | ICD-10-CM | POA: Diagnosis not present

## 2022-10-17 DIAGNOSIS — K2100 Gastro-esophageal reflux disease with esophagitis, without bleeding: Secondary | ICD-10-CM | POA: Diagnosis not present

## 2022-10-17 DIAGNOSIS — N1832 Chronic kidney disease, stage 3b: Secondary | ICD-10-CM | POA: Diagnosis not present

## 2022-10-17 DIAGNOSIS — D519 Vitamin B12 deficiency anemia, unspecified: Secondary | ICD-10-CM | POA: Diagnosis not present

## 2022-10-21 DIAGNOSIS — D510 Vitamin B12 deficiency anemia due to intrinsic factor deficiency: Secondary | ICD-10-CM | POA: Diagnosis not present

## 2022-10-21 DIAGNOSIS — E7849 Other hyperlipidemia: Secondary | ICD-10-CM | POA: Diagnosis not present

## 2022-10-21 DIAGNOSIS — I10 Essential (primary) hypertension: Secondary | ICD-10-CM | POA: Diagnosis not present

## 2022-10-21 DIAGNOSIS — K2100 Gastro-esophageal reflux disease with esophagitis, without bleeding: Secondary | ICD-10-CM | POA: Diagnosis not present

## 2022-11-12 DIAGNOSIS — Z6822 Body mass index (BMI) 22.0-22.9, adult: Secondary | ICD-10-CM | POA: Diagnosis not present

## 2022-11-12 DIAGNOSIS — G4762 Sleep related leg cramps: Secondary | ICD-10-CM | POA: Diagnosis not present

## 2022-11-12 DIAGNOSIS — S76312A Strain of muscle, fascia and tendon of the posterior muscle group at thigh level, left thigh, initial encounter: Secondary | ICD-10-CM | POA: Diagnosis not present

## 2022-11-12 DIAGNOSIS — N1831 Chronic kidney disease, stage 3a: Secondary | ICD-10-CM | POA: Diagnosis not present

## 2022-12-07 DIAGNOSIS — Z6822 Body mass index (BMI) 22.0-22.9, adult: Secondary | ICD-10-CM | POA: Diagnosis not present

## 2022-12-07 DIAGNOSIS — Z20828 Contact with and (suspected) exposure to other viral communicable diseases: Secondary | ICD-10-CM | POA: Diagnosis not present

## 2022-12-07 DIAGNOSIS — J189 Pneumonia, unspecified organism: Secondary | ICD-10-CM | POA: Diagnosis not present

## 2022-12-07 DIAGNOSIS — R059 Cough, unspecified: Secondary | ICD-10-CM | POA: Diagnosis not present

## 2023-02-17 DIAGNOSIS — Z6822 Body mass index (BMI) 22.0-22.9, adult: Secondary | ICD-10-CM | POA: Diagnosis not present

## 2023-02-17 DIAGNOSIS — M545 Low back pain, unspecified: Secondary | ICD-10-CM | POA: Diagnosis not present

## 2023-04-25 DIAGNOSIS — R5383 Other fatigue: Secondary | ICD-10-CM | POA: Diagnosis not present

## 2023-04-25 DIAGNOSIS — K2100 Gastro-esophageal reflux disease with esophagitis, without bleeding: Secondary | ICD-10-CM | POA: Diagnosis not present

## 2023-04-25 DIAGNOSIS — N183 Chronic kidney disease, stage 3 unspecified: Secondary | ICD-10-CM | POA: Diagnosis not present

## 2023-04-25 DIAGNOSIS — I10 Essential (primary) hypertension: Secondary | ICD-10-CM | POA: Diagnosis not present

## 2023-04-25 DIAGNOSIS — E7849 Other hyperlipidemia: Secondary | ICD-10-CM | POA: Diagnosis not present

## 2023-04-25 DIAGNOSIS — Z1329 Encounter for screening for other suspected endocrine disorder: Secondary | ICD-10-CM | POA: Diagnosis not present

## 2023-05-02 DIAGNOSIS — Z23 Encounter for immunization: Secondary | ICD-10-CM | POA: Diagnosis not present

## 2023-05-02 DIAGNOSIS — I10 Essential (primary) hypertension: Secondary | ICD-10-CM | POA: Diagnosis not present

## 2023-05-02 DIAGNOSIS — K2100 Gastro-esophageal reflux disease with esophagitis, without bleeding: Secondary | ICD-10-CM | POA: Diagnosis not present

## 2023-05-02 DIAGNOSIS — Z0001 Encounter for general adult medical examination with abnormal findings: Secondary | ICD-10-CM | POA: Diagnosis not present

## 2023-05-02 DIAGNOSIS — E7849 Other hyperlipidemia: Secondary | ICD-10-CM | POA: Diagnosis not present

## 2023-05-02 DIAGNOSIS — D510 Vitamin B12 deficiency anemia due to intrinsic factor deficiency: Secondary | ICD-10-CM | POA: Diagnosis not present

## 2023-08-24 DIAGNOSIS — Z6822 Body mass index (BMI) 22.0-22.9, adult: Secondary | ICD-10-CM | POA: Diagnosis not present

## 2023-08-24 DIAGNOSIS — U071 COVID-19: Secondary | ICD-10-CM | POA: Diagnosis not present

## 2023-09-06 DIAGNOSIS — Z6822 Body mass index (BMI) 22.0-22.9, adult: Secondary | ICD-10-CM | POA: Diagnosis not present

## 2023-09-06 DIAGNOSIS — J019 Acute sinusitis, unspecified: Secondary | ICD-10-CM | POA: Diagnosis not present

## 2023-10-07 DIAGNOSIS — N419 Inflammatory disease of prostate, unspecified: Secondary | ICD-10-CM | POA: Diagnosis not present

## 2023-10-07 DIAGNOSIS — R300 Dysuria: Secondary | ICD-10-CM | POA: Diagnosis not present

## 2023-10-07 DIAGNOSIS — Z6822 Body mass index (BMI) 22.0-22.9, adult: Secondary | ICD-10-CM | POA: Diagnosis not present

## 2023-10-18 DIAGNOSIS — N1831 Chronic kidney disease, stage 3a: Secondary | ICD-10-CM | POA: Diagnosis not present

## 2023-10-18 DIAGNOSIS — E7849 Other hyperlipidemia: Secondary | ICD-10-CM | POA: Diagnosis not present

## 2023-10-18 DIAGNOSIS — D649 Anemia, unspecified: Secondary | ICD-10-CM | POA: Diagnosis not present

## 2023-10-25 DIAGNOSIS — I700 Atherosclerosis of aorta: Secondary | ICD-10-CM | POA: Diagnosis not present

## 2023-10-25 DIAGNOSIS — N1832 Chronic kidney disease, stage 3b: Secondary | ICD-10-CM | POA: Diagnosis not present

## 2023-10-25 DIAGNOSIS — M79672 Pain in left foot: Secondary | ICD-10-CM | POA: Diagnosis not present

## 2023-10-25 DIAGNOSIS — K2100 Gastro-esophageal reflux disease with esophagitis, without bleeding: Secondary | ICD-10-CM | POA: Diagnosis not present

## 2023-10-25 DIAGNOSIS — Z23 Encounter for immunization: Secondary | ICD-10-CM | POA: Diagnosis not present

## 2023-10-25 DIAGNOSIS — I10 Essential (primary) hypertension: Secondary | ICD-10-CM | POA: Diagnosis not present

## 2023-10-25 DIAGNOSIS — D510 Vitamin B12 deficiency anemia due to intrinsic factor deficiency: Secondary | ICD-10-CM | POA: Diagnosis not present

## 2023-10-25 DIAGNOSIS — Z6822 Body mass index (BMI) 22.0-22.9, adult: Secondary | ICD-10-CM | POA: Diagnosis not present

## 2023-10-25 DIAGNOSIS — E7849 Other hyperlipidemia: Secondary | ICD-10-CM | POA: Diagnosis not present

## 2023-10-31 DIAGNOSIS — N183 Chronic kidney disease, stage 3 unspecified: Secondary | ICD-10-CM | POA: Diagnosis not present

## 2023-12-23 DIAGNOSIS — R300 Dysuria: Secondary | ICD-10-CM | POA: Diagnosis not present

## 2023-12-23 DIAGNOSIS — N401 Enlarged prostate with lower urinary tract symptoms: Secondary | ICD-10-CM | POA: Diagnosis not present

## 2023-12-23 DIAGNOSIS — Z6822 Body mass index (BMI) 22.0-22.9, adult: Secondary | ICD-10-CM | POA: Diagnosis not present

## 2023-12-23 DIAGNOSIS — N419 Inflammatory disease of prostate, unspecified: Secondary | ICD-10-CM | POA: Diagnosis not present

## 2023-12-23 DIAGNOSIS — R509 Fever, unspecified: Secondary | ICD-10-CM | POA: Diagnosis not present

## 2024-02-22 DIAGNOSIS — E7849 Other hyperlipidemia: Secondary | ICD-10-CM | POA: Diagnosis not present

## 2024-02-22 DIAGNOSIS — E782 Mixed hyperlipidemia: Secondary | ICD-10-CM | POA: Diagnosis not present

## 2024-02-22 DIAGNOSIS — N183 Chronic kidney disease, stage 3 unspecified: Secondary | ICD-10-CM | POA: Diagnosis not present

## 2024-02-22 DIAGNOSIS — Z0001 Encounter for general adult medical examination with abnormal findings: Secondary | ICD-10-CM | POA: Diagnosis not present

## 2024-02-22 DIAGNOSIS — I10 Essential (primary) hypertension: Secondary | ICD-10-CM | POA: Diagnosis not present

## 2024-02-28 DIAGNOSIS — K2100 Gastro-esophageal reflux disease with esophagitis, without bleeding: Secondary | ICD-10-CM | POA: Diagnosis not present

## 2024-02-28 DIAGNOSIS — E7849 Other hyperlipidemia: Secondary | ICD-10-CM | POA: Diagnosis not present

## 2024-02-28 DIAGNOSIS — I10 Essential (primary) hypertension: Secondary | ICD-10-CM | POA: Diagnosis not present

## 2024-02-28 DIAGNOSIS — Z6822 Body mass index (BMI) 22.0-22.9, adult: Secondary | ICD-10-CM | POA: Diagnosis not present

## 2024-02-28 DIAGNOSIS — M1611 Unilateral primary osteoarthritis, right hip: Secondary | ICD-10-CM | POA: Diagnosis not present

## 2024-02-28 DIAGNOSIS — E782 Mixed hyperlipidemia: Secondary | ICD-10-CM | POA: Diagnosis not present

## 2024-05-13 DIAGNOSIS — D649 Anemia, unspecified: Secondary | ICD-10-CM | POA: Diagnosis not present

## 2024-05-13 DIAGNOSIS — Z1329 Encounter for screening for other suspected endocrine disorder: Secondary | ICD-10-CM | POA: Diagnosis not present

## 2024-05-13 DIAGNOSIS — I10 Essential (primary) hypertension: Secondary | ICD-10-CM | POA: Diagnosis not present

## 2024-05-13 DIAGNOSIS — E7849 Other hyperlipidemia: Secondary | ICD-10-CM | POA: Diagnosis not present

## 2024-05-13 DIAGNOSIS — E559 Vitamin D deficiency, unspecified: Secondary | ICD-10-CM | POA: Diagnosis not present

## 2024-05-13 DIAGNOSIS — D559 Anemia due to enzyme disorder, unspecified: Secondary | ICD-10-CM | POA: Diagnosis not present

## 2024-05-20 DIAGNOSIS — Z1331 Encounter for screening for depression: Secondary | ICD-10-CM | POA: Diagnosis not present

## 2024-05-20 DIAGNOSIS — E7849 Other hyperlipidemia: Secondary | ICD-10-CM | POA: Diagnosis not present

## 2024-05-20 DIAGNOSIS — Z0001 Encounter for general adult medical examination with abnormal findings: Secondary | ICD-10-CM | POA: Diagnosis not present

## 2024-05-20 DIAGNOSIS — Z6822 Body mass index (BMI) 22.0-22.9, adult: Secondary | ICD-10-CM | POA: Diagnosis not present

## 2024-05-20 DIAGNOSIS — M1611 Unilateral primary osteoarthritis, right hip: Secondary | ICD-10-CM | POA: Diagnosis not present

## 2024-05-20 DIAGNOSIS — Z1389 Encounter for screening for other disorder: Secondary | ICD-10-CM | POA: Diagnosis not present

## 2024-05-20 DIAGNOSIS — K2100 Gastro-esophageal reflux disease with esophagitis, without bleeding: Secondary | ICD-10-CM | POA: Diagnosis not present

## 2024-05-20 DIAGNOSIS — E782 Mixed hyperlipidemia: Secondary | ICD-10-CM | POA: Diagnosis not present

## 2024-05-20 DIAGNOSIS — I10 Essential (primary) hypertension: Secondary | ICD-10-CM | POA: Diagnosis not present

## 2024-08-06 DIAGNOSIS — R051 Acute cough: Secondary | ICD-10-CM | POA: Diagnosis not present

## 2024-08-06 DIAGNOSIS — Z20828 Contact with and (suspected) exposure to other viral communicable diseases: Secondary | ICD-10-CM | POA: Diagnosis not present

## 2024-08-06 DIAGNOSIS — J0100 Acute maxillary sinusitis, unspecified: Secondary | ICD-10-CM | POA: Diagnosis not present

## 2024-08-06 DIAGNOSIS — Z6822 Body mass index (BMI) 22.0-22.9, adult: Secondary | ICD-10-CM | POA: Diagnosis not present

## 2024-08-06 DIAGNOSIS — Z2089 Contact with and (suspected) exposure to other communicable diseases: Secondary | ICD-10-CM | POA: Diagnosis not present

## 2024-10-07 DIAGNOSIS — Z23 Encounter for immunization: Secondary | ICD-10-CM | POA: Diagnosis not present

## 2024-11-19 DIAGNOSIS — Z6822 Body mass index (BMI) 22.0-22.9, adult: Secondary | ICD-10-CM | POA: Diagnosis not present

## 2024-11-19 DIAGNOSIS — M1611 Unilateral primary osteoarthritis, right hip: Secondary | ICD-10-CM | POA: Diagnosis not present

## 2024-11-19 DIAGNOSIS — I10 Essential (primary) hypertension: Secondary | ICD-10-CM | POA: Diagnosis not present

## 2024-11-19 DIAGNOSIS — K2100 Gastro-esophageal reflux disease with esophagitis, without bleeding: Secondary | ICD-10-CM | POA: Diagnosis not present

## 2024-11-19 DIAGNOSIS — Z20828 Contact with and (suspected) exposure to other viral communicable diseases: Secondary | ICD-10-CM | POA: Diagnosis not present

## 2024-11-19 DIAGNOSIS — N1832 Chronic kidney disease, stage 3b: Secondary | ICD-10-CM | POA: Diagnosis not present

## 2024-11-19 DIAGNOSIS — Z2089 Contact with and (suspected) exposure to other communicable diseases: Secondary | ICD-10-CM | POA: Diagnosis not present

## 2024-11-19 DIAGNOSIS — E7849 Other hyperlipidemia: Secondary | ICD-10-CM | POA: Diagnosis not present
